# Patient Record
Sex: Male | Born: 1990 | Race: Black or African American | Hispanic: No | Marital: Single | Smoking: Former smoker
Health system: Southern US, Community
[De-identification: ages and names within clinical notes are randomized; demographics above are authoritative.]

## PROBLEM LIST (undated history)

## (undated) DIAGNOSIS — G43909 Migraine, unspecified, not intractable, without status migrainosus: Secondary | ICD-10-CM

## (undated) DIAGNOSIS — K649 Unspecified hemorrhoids: Secondary | ICD-10-CM

---

## 2015-11-02 ENCOUNTER — Emergency Department (HOSPITAL_BASED_OUTPATIENT_CLINIC_OR_DEPARTMENT_OTHER): Payer: Self-pay

## 2015-11-02 ENCOUNTER — Encounter (HOSPITAL_BASED_OUTPATIENT_CLINIC_OR_DEPARTMENT_OTHER): Payer: Self-pay | Admitting: Emergency Medicine

## 2015-11-02 DIAGNOSIS — W1839XA Other fall on same level, initial encounter: Secondary | ICD-10-CM | POA: Insufficient documentation

## 2015-11-02 DIAGNOSIS — Z87891 Personal history of nicotine dependence: Secondary | ICD-10-CM | POA: Insufficient documentation

## 2015-11-02 DIAGNOSIS — S138XXA Sprain of joints and ligaments of other parts of neck, initial encounter: Secondary | ICD-10-CM | POA: Insufficient documentation

## 2015-11-02 DIAGNOSIS — Z8679 Personal history of other diseases of the circulatory system: Secondary | ICD-10-CM | POA: Insufficient documentation

## 2015-11-02 DIAGNOSIS — Y9289 Other specified places as the place of occurrence of the external cause: Secondary | ICD-10-CM | POA: Insufficient documentation

## 2015-11-02 DIAGNOSIS — Y998 Other external cause status: Secondary | ICD-10-CM | POA: Insufficient documentation

## 2015-11-02 DIAGNOSIS — Y9344 Activity, trampolining: Secondary | ICD-10-CM | POA: Insufficient documentation

## 2015-11-02 DIAGNOSIS — Z8719 Personal history of other diseases of the digestive system: Secondary | ICD-10-CM | POA: Insufficient documentation

## 2015-11-02 NOTE — ED Notes (Signed)
Injury to neck at 2pm while jumping on trampoline. Pain and tenderness. C-Collar placed in traige.

## 2015-11-03 ENCOUNTER — Emergency Department (HOSPITAL_BASED_OUTPATIENT_CLINIC_OR_DEPARTMENT_OTHER)
Admission: EM | Admit: 2015-11-03 | Discharge: 2015-11-03 | Disposition: A | Discharge status: Home / Self Care | Payer: Self-pay | Attending: Emergency Medicine | Admitting: Emergency Medicine

## 2015-11-03 DIAGNOSIS — IMO0001 Reserved for inherently not codable concepts without codable children: Secondary | ICD-10-CM

## 2015-11-03 DIAGNOSIS — S139XXA Sprain of joints and ligaments of unspecified parts of neck, initial encounter: Secondary | ICD-10-CM

## 2015-11-03 HISTORY — DX: Unspecified hemorrhoids: K64.9

## 2015-11-03 HISTORY — DX: Migraine, unspecified, not intractable, without status migrainosus: G43.909

## 2015-11-03 MED ORDER — NAPROXEN 250 MG PO TABS
500.0000 mg | ORAL_TABLET | Freq: Once | ORAL | Status: AC
  Administered 2015-11-03: 500 mg via ORAL
  Filled 2015-11-03: qty 2

## 2015-11-03 MED ORDER — HYDROCODONE-ACETAMINOPHEN 5-325 MG PO TABS: 1.0000 | ORAL_TABLET | Freq: Four times a day (QID) | ORAL | Status: DC | PRN

## 2015-11-03 NOTE — ED Provider Notes (Addendum)
CSN: [id number]     Arrival date & time 11/02/15  2251 History   First MD Initiated Contact with Patient 11/03/15 0132     Chief Complaint  Patient presents with  . Neck Injury     (Consider location/radiation/quality/duration/timing/severity/associated sxs/prior Treatment) HPI  This is a 25 year old male who was jumping on a trampoline with his children about 2 PM yesterday afternoon. In attempting to do a back flip he hyperflexed his neck and felt a pop. He has subsequently had moderate to severe posterior midline neck pain worse with movement of the neck. He is not having any numbness or weakness. He was placed in a c-collar on arrival.  Past Medical History  Diagnosis Date  . Migraines   . Hemorrhoids    History reviewed. No pertinent past surgical history. No family history on file. Social History  Substance Use Topics  . Smoking status: Former Games developer  . Smokeless tobacco: None  . Alcohol Use: No    Review of Systems  All other systems reviewed and are negative.   Allergies  Review of patient's allergies indicates no known allergies.  Home Medications   Prior to Admission medications   Not on File   BP 121/82 mmHg  Pulse 65  Temp(Src) 97.7 F (36.5 C) (Oral)  Resp 20  Ht 6' (1.829 m)  Wt 185 lb (83.915 kg)  BMI 25.08 kg/m2  SpO2 100%   Physical Exam  General: Well-developed, well-nourished male in no acute distress; appearance consistent with age of record HENT: normocephalic; atraumatic Eyes: pupils equal, round and reactive to light; extraocular muscles intact Neck: supple; posterior midline tenderness; pain reproduced on rotation, flexion and extension of neck; no muscular tenderness or spasm palpated Heart: regular rate and rhythm Lungs: clear to auscultation bilaterally Abdomen: soft; nondistended; nontender; bowel sounds present Back: No T-spine or LS-spine tenderness Extremities: No deformity; full range of motion; pulses normal Neurologic:  Awake, alert and oriented; motor function intact in all extremities and symmetric; no facial droop Skin: Warm and dry Psychiatric: Normal mood and affect    ED Course  Procedures (including critical care time)   MDM  Nursing notes and vitals signs, including pulse oximetry, reviewed.  Summary of this visit's results, reviewed by myself:  Imaging Studies: Ct Cervical Spine Wo Contrast  11/03/2015  CLINICAL DATA:  Neck injury 12 hours prior while jumping on trampoline. Neck pain and tenderness. Fall. EXAM: CT CERVICAL SPINE WITHOUT CONTRAST TECHNIQUE: Multidetector CT imaging of the cervical spine was performed without intravenous contrast. Multiplanar CT image reconstructions were also generated. COMPARISON:  None. FINDINGS: Mild broad-based leftward curvature, alignment is otherwise maintained. Vertebral body heights and intervertebral disc spaces are preserved. There is no fracture. The dens is intact. There are no jumped or perched facets. No prevertebral soft tissue edema. IMPRESSION: Mild leftward curvature of the spine, may be muscle spasm or positioning versus scoliosis. No acute fracture or subluxation. Electronically Signed   By: Rubye Oaks M.D.   On: 11/03/2015 01:29   We'll discharge with [place] collar and refer to sports medicine.    Paula Libra, MD 11/03/15 0140  Paula Libra, MD 11/03/15 7829  Paula Libra, MD 11/03/15 [phone]

## 2015-11-03 NOTE — ED Notes (Signed)
Patient transported to CT 

## 2015-11-03 NOTE — ED Notes (Signed)
Pt discharged home with c collar in place per edp order.

## 2015-11-04 ENCOUNTER — Ambulatory Visit (INDEPENDENT_AMBULATORY_CARE_PROVIDER_SITE_OTHER): Payer: 59 | Admitting: Family Medicine

## 2015-11-04 ENCOUNTER — Encounter: Payer: Self-pay | Admitting: Family Medicine

## 2015-11-04 VITALS — BP 123/74 | HR 73 | Ht 72.0 in | Wt 185.0 lb

## 2015-11-04 DIAGNOSIS — S199XXA Unspecified injury of neck, initial encounter: Secondary | ICD-10-CM | POA: Diagnosis not present

## 2015-11-04 MED ORDER — PREDNISONE 10 MG PO TABS: ORAL_TABLET | ORAL | Status: DC

## 2015-11-04 MED ORDER — DICLOFENAC SODIUM 75 MG PO TBEC: 75.0000 mg | DELAYED_RELEASE_TABLET | Freq: Two times a day (BID) | ORAL | Status: DC

## 2015-11-04 MED ORDER — OXYCODONE-ACETAMINOPHEN 7.5-325 MG PO TABS: 1.0000 | ORAL_TABLET | Freq: Four times a day (QID) | ORAL | Status: DC | PRN

## 2015-11-04 NOTE — Patient Instructions (Signed)
You sustained a whiplash injury and severe cervical strain. A prednisone dose pack is the best option for immediate relief and may be prescribed. Day after finishing prednisone start voltaren  twice a day with food for pain and inflammation. Percocet as needed for severe pain (no driving on this medicine). Stay as active as possible - active assist motion exercises twice a day. Cervical collar other times. Physical therapy has been shown to be helpful as well though I would wait until I see you back to start this. If not improving, will consider further imaging (MRI). Follow up with me in 10-14 days.

## 2015-11-06 DIAGNOSIS — S199XXA Unspecified injury of neck, initial encounter: Secondary | ICD-10-CM | POA: Insufficient documentation

## 2015-11-06 NOTE — Assessment & Plan Note (Signed)
severe hyperflexion injury causing severe cervical strain.  Continue with cervical collar though come out of this at least twice a day to work on regaining motion.  Prednisone dose pack with transition to voltaren.  Percocet as needed.  Work note provided.  F/u in 10-14 days.  Physical therapy if improving, MRI if not improving.

## 2015-11-06 NOTE — Progress Notes (Signed)
PCP: No PCP Per Patient  Subjective:   HPI: Patient is a 25 y.o. male here for neck injury.  Patient reports on 2/25 he was jumping on a trampoline. Did a backflip but landed on head/neck hyperflexing his neck. Several pops felt at the time and severe pain in neck. No radiation into extremities. No numbness or tingling. CT cervical spine normal in ED. Still with 9/10 level of pain, sharp diffuse posterior neck. Wearing a cervical collar, using icy hot and heating pad.  Not much help with norco. No skin changes, fever.  Past Medical History  Diagnosis Date  . Migraines   . Hemorrhoids     Current Outpatient Prescriptions on File Prior to Visit  Medication Sig Dispense Refill  . HYDROcodone-acetaminophen (NORCO) 5-325 MG tablet Take 1-2 tablets by mouth every 6 (six) hours as needed (for pain). 10 tablet 0   No current facility-administered medications on file prior to visit.    No past surgical history on file.  No Known Allergies  Social History   Social History  . Marital Status: Single    Spouse Name: N/A  . Number of Children: N/A  . Years of Education: N/A   Occupational History  . Not on file.   Social History Main Topics  . Smoking status: Former Games developer  . Smokeless tobacco: Not on file  . Alcohol Use: No  . Drug Use: Yes    Special: Marijuana     Comment: last used a few days ago  . Sexual Activity: Not on file   Other Topics Concern  . Not on file   Social History Narrative    No family history on file.  BP 123/74 mmHg  Pulse 73  Ht 6' (1.829 m)  Wt 185 lb (83.915 kg)  BMI 25.08 kg/m2  Review of Systems: See HPI above.    Objective:  Physical Exam:  Gen: NAD, wearing cervical collar, appears uncomfortable.  Neck: No gross deformity, swelling, bruising.  Spasms bilateral cervical paraspinal regions. TTP diffusely posteriorly cervical spine and paraspinal regions.  No midline/bony TTP. Only 5 degrees motion all directions, all  painful. BUE strength 5/5.   Sensation intact to light touch.   1+ equal reflexes in triceps, biceps, brachioradialis tendons. NV intact distal BUEs.    Assessment & Plan:  1. Neck injury - severe hyperflexion injury causing severe cervical strain.  Continue with cervical collar though come out of this at least twice a day to work on regaining motion.  Prednisone dose pack with transition to voltaren.  Percocet as needed.  Work note provided.  F/u in 10-14 days.  Physical therapy if improving, MRI if not improving.

## 2015-11-13 ENCOUNTER — Ambulatory Visit (INDEPENDENT_AMBULATORY_CARE_PROVIDER_SITE_OTHER): Payer: 59 | Admitting: Family Medicine

## 2015-11-13 ENCOUNTER — Encounter: Payer: Self-pay | Admitting: Family Medicine

## 2015-11-13 VITALS — BP 121/78 | HR 83 | Ht 72.0 in | Wt 185.0 lb

## 2015-11-13 DIAGNOSIS — S199XXD Unspecified injury of neck, subsequent encounter: Secondary | ICD-10-CM | POA: Diagnosis not present

## 2015-11-13 NOTE — Patient Instructions (Signed)
Call me in 2-3 weeks if you want to do physical therapy or you have any questions. Otherwise follow up with me as needed.

## 2015-11-15 NOTE — Progress Notes (Signed)
PCP: No PCP Per Patient  Subjective:   HPI: Patient is a 25 y.o. male here for neck injury.  2/27: Patient reports on 2/25 he was jumping on a trampoline. Did a backflip but landed on head/neck hyperflexing his neck. Several pops felt at the time and severe pain in neck. No radiation into extremities. No numbness or tingling. CT cervical spine normal in ED. Still with 9/10 level of pain, sharp diffuse posterior neck. Wearing a cervical collar, using icy hot and heating pad.  Not much help with norco. No skin changes, fever.  3/8: Patient reports he feels significantly better since last visit. His pain is currently 0/10. Some soreness just medial to and above shoulder blades. Using cervical collar. S/p using voltaren, prednisone, and percocet. No skin changes, fever. No radiation into extremities. No numbness or tingling.  Past Medical History  Diagnosis Date  . Migraines   . Hemorrhoids     Current Outpatient Prescriptions on File Prior to Visit  Medication Sig Dispense Refill  . diclofenac (VOLTAREN) 75 MG EC tablet Take 1 tablet (75 mg total) by mouth 2 (two) times daily. Start day AFTER finishing prednisone 60 tablet 1  . HYDROcodone-acetaminophen (NORCO) 5-325 MG tablet Take 1-2 tablets by mouth every 6 (six) hours as needed (for pain). 10 tablet 0  . oxyCODONE-acetaminophen (PERCOCET) 7.5-325 MG tablet Take 1 tablet by mouth every 6 (six) hours as needed for severe pain. 60 tablet 0  . predniSONE (DELTASONE) 10 MG tablet 6 tabs po day 1, 5 tabs po day 2, 4 tabs po day 3, 3 tabs po day 4, 2 tabs po day 5, 1 tab po day 6 21 tablet 0   No current facility-administered medications on file prior to visit.    No past surgical history on file.  No Known Allergies  Social History   Social History  . Marital Status: Single    Spouse Name: N/A  . Number of Children: N/A  . Years of Education: N/A   Occupational History  . Not on file.   Social History Main Topics   . Smoking status: Former Games developermoker  . Smokeless tobacco: Not on file  . Alcohol Use: No  . Drug Use: Yes    Special: Marijuana     Comment: last used a few days ago  . Sexual Activity: Not on file   Other Topics Concern  . Not on file   Social History Narrative    No family history on file.  BP 121/78 mmHg  Pulse 83  Ht 6' (1.829 m)  Wt 185 lb (83.915 kg)  BMI 25.08 kg/m2  Review of Systems: See HPI above.    Objective:  Physical Exam:  Gen: NAD, wearing cervical collar, appears uncomfortable.  Neck: No gross deformity, swelling, bruising.   Minimal TTP bilateral trapezius muscles.  No midline/bony TTP. FROM. BUE strength 5/5.   Sensation intact to light touch.   1+ equal reflexes in triceps, biceps, brachioradialis tendons. NV intact distal BUEs.    Assessment & Plan:  1. Neck injury - severe hyperflexion injury causing severe cervical strain.  Significant improvement over past week following prednisone, voltaren, percocet, and collar.  Discontinue cervical collar.  Not needing medication now.  Simple home exercises.  Call us if still not improving over next 2-3 weeks and will consider physical therapy.

## 2015-11-15 NOTE — Assessment & Plan Note (Signed)
severe hyperflexion injury causing severe cervical strain.  Significant improvement over past week following prednisone, voltaren, percocet, and collar.  Discontinue cervical collar.  Not needing medication now.  Simple home exercises.  Call us if still not improving over next 2-3 weeks and will consider physical therapy.

## 2017-04-23 IMAGING — CT CT CERVICAL SPINE W/O CM
3 of 4 series · 12 of 33 positions shown, 14 images · non-contrast
Comparison: None.

CLINICAL DATA: Neck injury 12 hours prior while jumping on
trampoline. Neck pain and tenderness. Fall.

EXAM:
CT CERVICAL SPINE WITHOUT CONTRAST
TECHNIQUE: Multidetector CT imaging of the cervical spine was performed without
intravenous contrast. Multiplanar CT image reconstructions were also
generated.

[Series 6: sagittal bone · sagittal · 0.37mm/px · 5 of 74 slices shown, 6 images]
[im 25/74  bone]
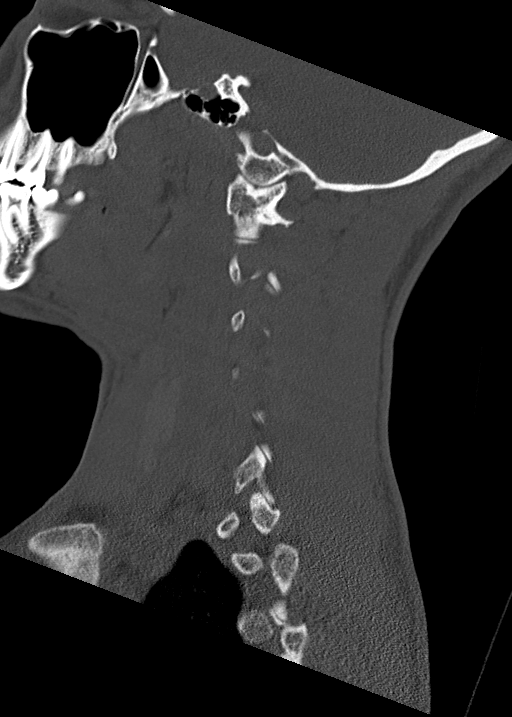
[im 31/74  bone]
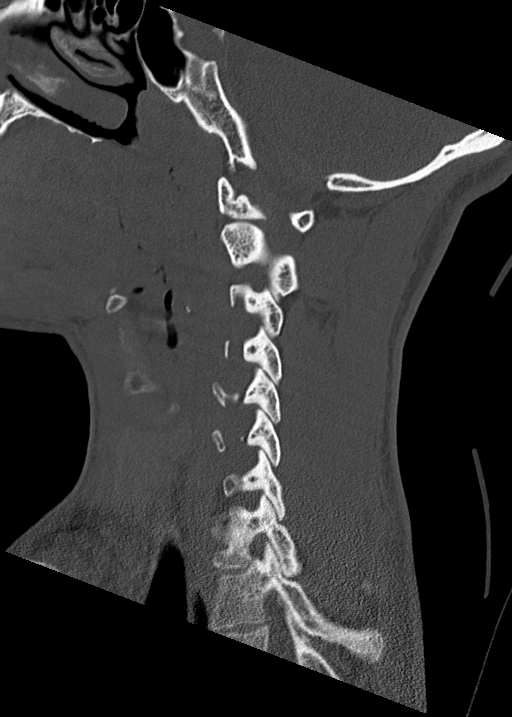
[im 37/74  soft-tissue]
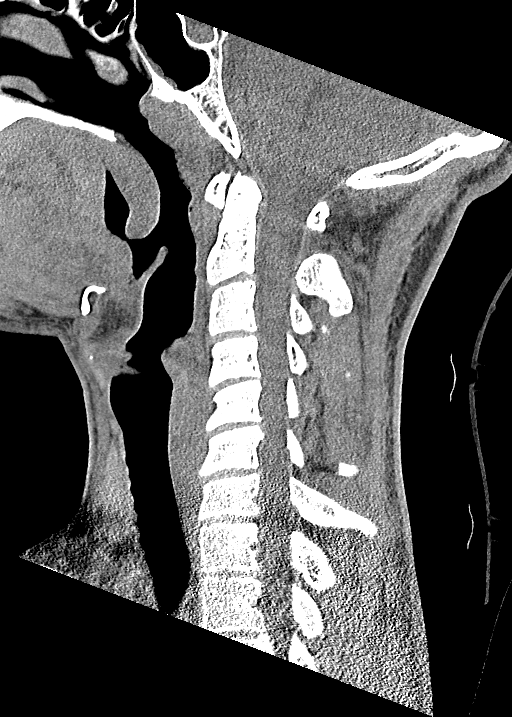
[im 37/74  bone]
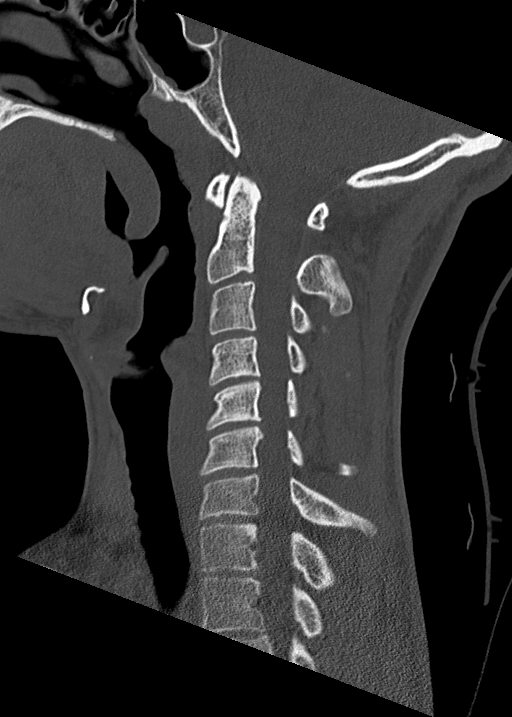
[im 43/74  bone]
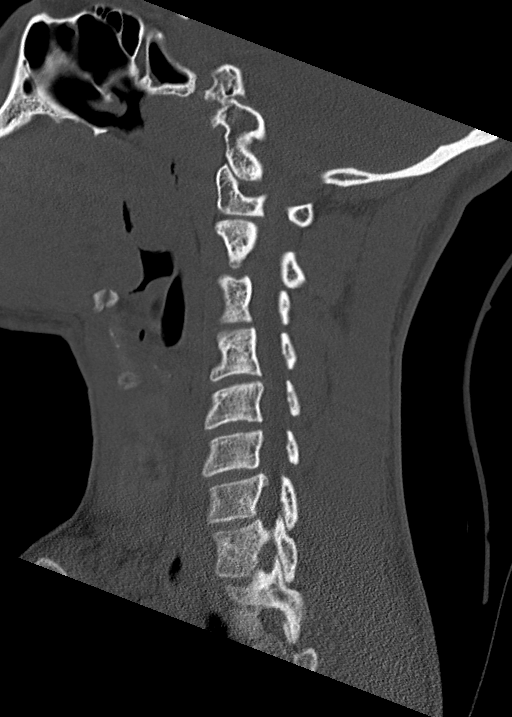
[im 49/74  bone]
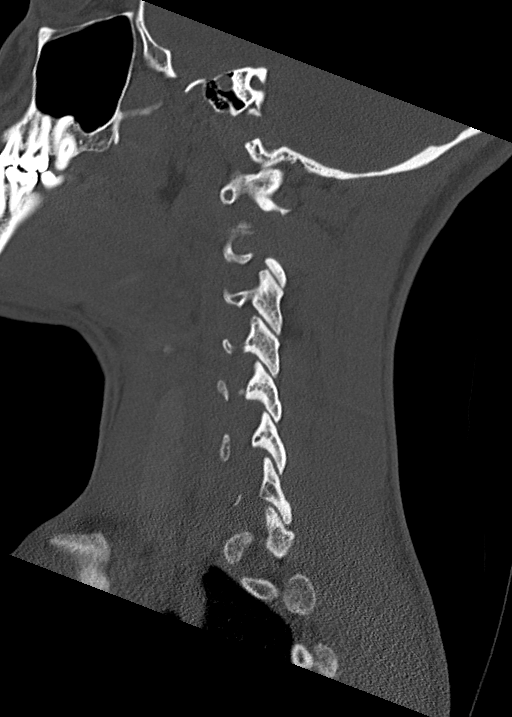

[Series 7: coronal bone · coronal · 0.30mm/px · 3 of 72 slices shown]
[im 15/72  bone]
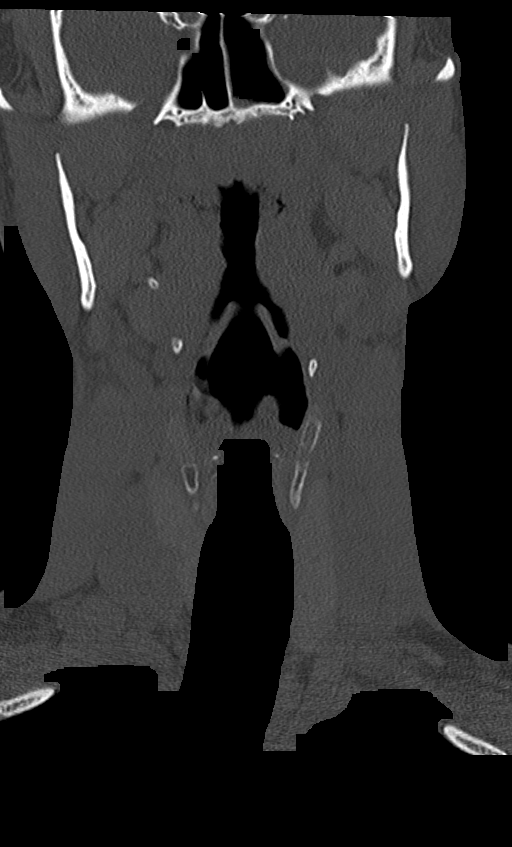
[im 29/72  bone]
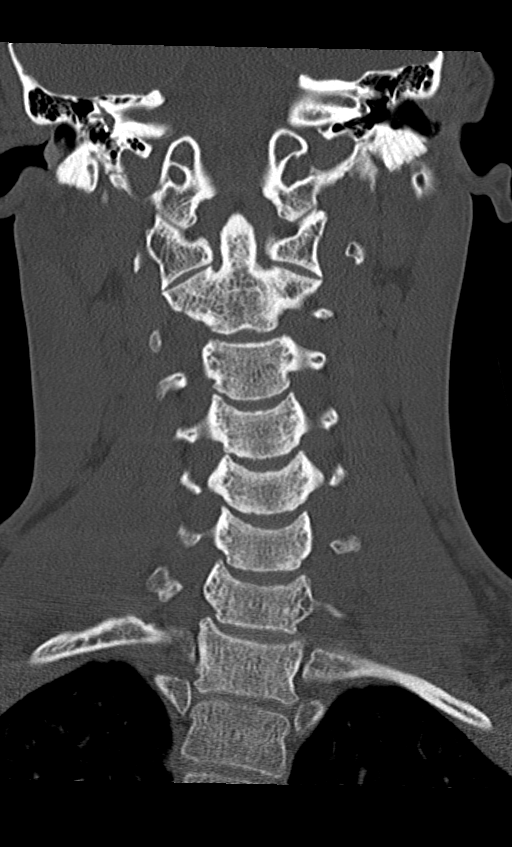
[im 43/72  bone]
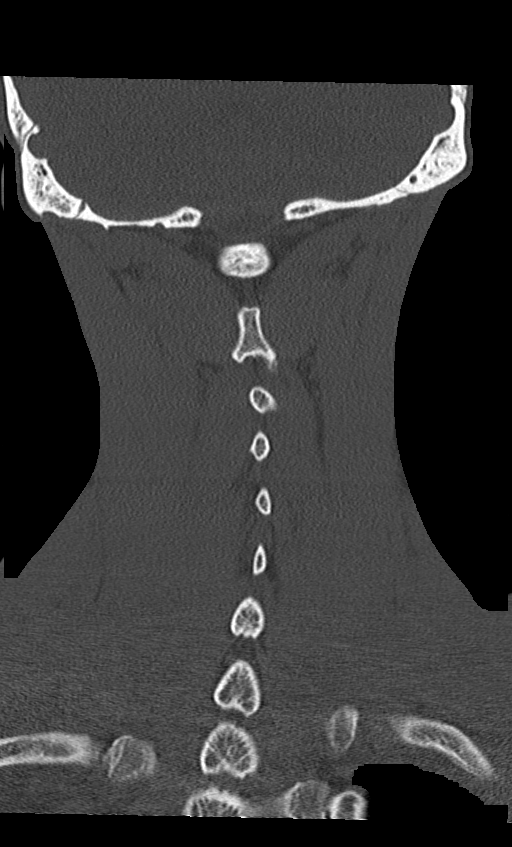

[Series 8: orthogonal bone · axial · 0.28mm/px · z∈[+732,+892]mm · 4 of 120 slices shown, 5 images]
[im 18/120  soft-tissue]
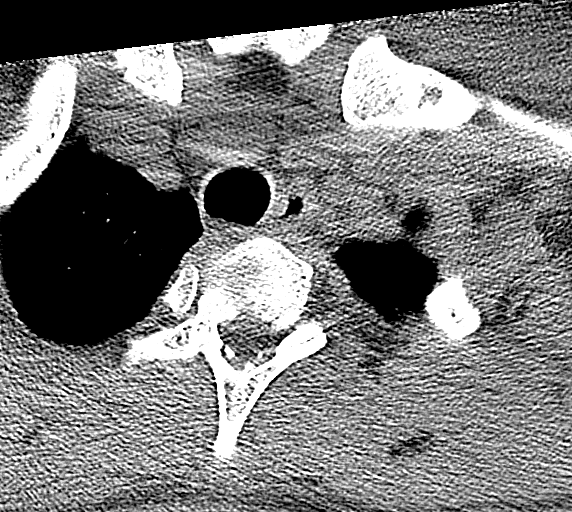
[im 18/120  bone]
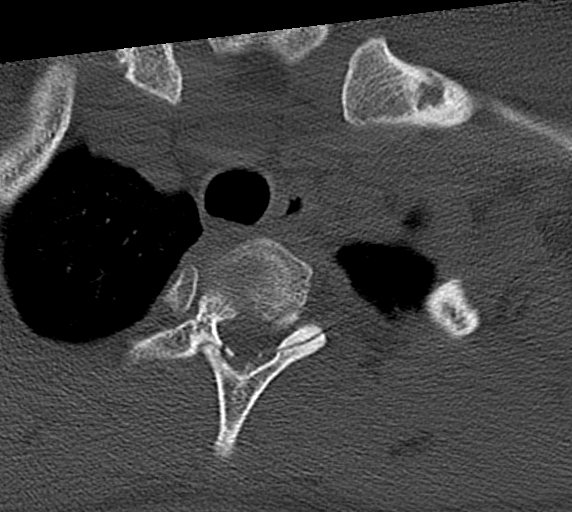
[im 52/120  bone]
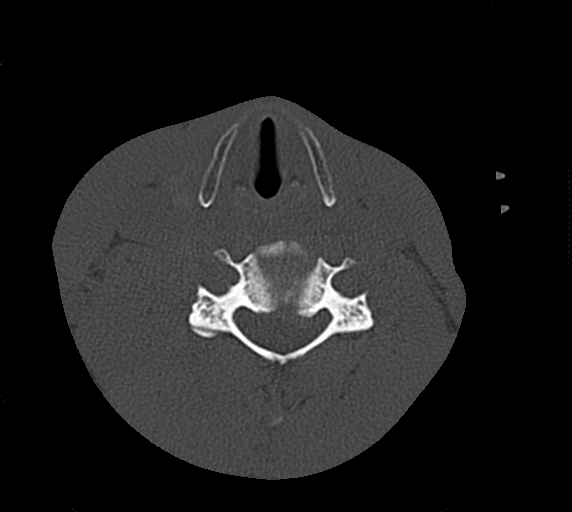
[im 69/120  bone]
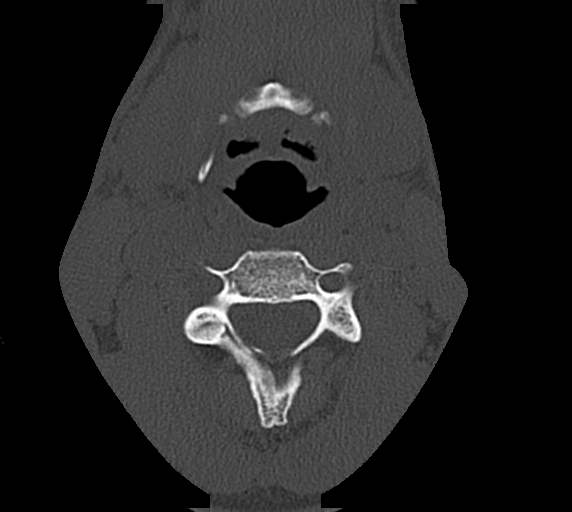
[im 103/120  bone]
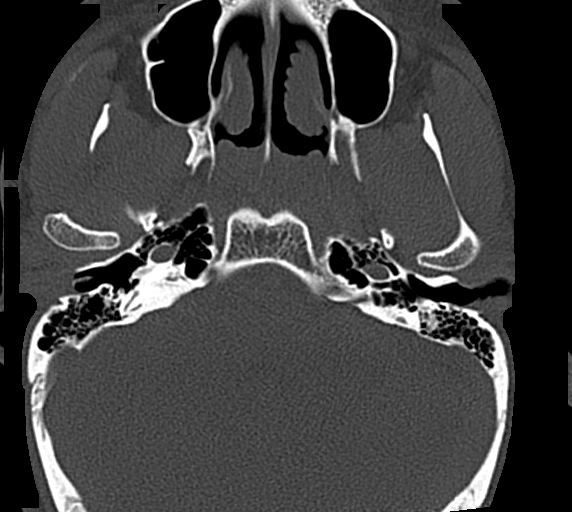

[12 of 33 positions shown; findings below may reference images not displayed]

FINDINGS: Mild broad-based leftward curvature, alignment is otherwise
maintained. Vertebral body heights and intervertebral disc spaces
are preserved. There is no fracture. The dens is intact. There are
no jumped or perched facets. No prevertebral soft tissue edema.
IMPRESSION: Mild leftward curvature of the spine, may be muscle spasm or
positioning versus scoliosis. No acute fracture or subluxation.

## 2017-06-10 DIAGNOSIS — G43009 Migraine without aura, not intractable, without status migrainosus: Secondary | ICD-10-CM | POA: Insufficient documentation

## 2017-06-10 DIAGNOSIS — F17200 Nicotine dependence, unspecified, uncomplicated: Secondary | ICD-10-CM | POA: Insufficient documentation

## 2019-06-05 ENCOUNTER — Ambulatory Visit
Admission: EM | Admit: 2019-06-05 | Discharge: 2019-06-05 | Disposition: A | Discharge status: Home / Self Care | Payer: BC Managed Care – PPO | Attending: Physician Assistant | Admitting: Physician Assistant

## 2019-06-05 ENCOUNTER — Encounter: Payer: Self-pay | Admitting: Emergency Medicine

## 2019-06-05 ENCOUNTER — Other Ambulatory Visit: Payer: Self-pay

## 2019-06-05 DIAGNOSIS — G8929 Other chronic pain: Secondary | ICD-10-CM

## 2019-06-05 DIAGNOSIS — M545 Low back pain, unspecified: Secondary | ICD-10-CM

## 2019-06-05 MED ORDER — METHOCARBAMOL 500 MG PO TABS: 500.0000 mg | ORAL_TABLET | Freq: Two times a day (BID) | ORAL | 0 refills | Status: DC

## 2019-06-05 MED ORDER — MELOXICAM 7.5 MG PO TABS: 7.5000 mg | ORAL_TABLET | Freq: Every day | ORAL | 0 refills | Status: DC

## 2019-06-05 NOTE — ED Provider Notes (Signed)
[facility]    CSN: [id number] Arrival date & time: 06/05/19  1443      History   Chief Complaint Chief Complaint  Patient presents with  . Back Pain    HPI Ballard Dipaola is a 28 y.o. male.   28 year old male comes in for 35-month history of low back pain that has worsened in the past 4 days.  Denies injury/trauma.  States pain is now constant, worse with movement.  Pain is mostly left-sided, but can be right-sided/bilateral depending on movement.  Denies saddle anesthesia, loss of bladder or bowel control.  Denies numbness/tingling.  Denies radiation down lower extremities.  Work requires heavy lifting.  Patient has tried naproxen to 20 mg twice daily for few days, IcyHot, warm compress without relief.  He has also tried increasing exercise, stretches without relief.     Past Medical History:  Diagnosis Date  . Hemorrhoids   . Migraines     Patient Active Problem List   Diagnosis Date Noted  . Neck injury 11/06/2015    History reviewed. No pertinent surgical history.     Home Medications    Prior to Admission medications   Medication Sig Start Date End Date Taking? Authorizing Provider  meloxicam (MOBIC) 7.5 MG tablet Take 1 tablet (7.5 mg total) by mouth daily. 06/05/19   Tasia Catchings,  V, PA-C  methocarbamol (ROBAXIN) 500 MG tablet Take 1 tablet (500 mg total) by mouth 2 (two) times daily. 06/05/19   Ok Edwards, PA-C    Family History History reviewed. No pertinent family history.  Social History Social History   Tobacco Use  . Smoking status: Former Research scientist (life sciences)  . Smokeless tobacco: Never Used  Substance Use Topics  . Alcohol use: No    Alcohol/week: 0.0 standard drinks  . Drug use: Yes    Types: Marijuana    Comment: last used a few days ago     Allergies   Patient has no known allergies.   Review of Systems Review of Systems  Reason unable to perform ROS: See HPI as above.     Physical Exam Triage Vital Signs ED Triage Vitals  Enc Vitals  Group     BP 06/05/19 1454 109/70     Pulse Rate 06/05/19 1454 96     Resp 06/05/19 1454 18     Temp 06/05/19 1454 98.6 F (37 C)     Temp Source 06/05/19 1454 Oral     SpO2 06/05/19 1454 98 %     Weight --      Height --      Head Circumference --      Peak Flow --      Pain Score 06/05/19 1457 5     Pain Loc --      Pain Edu? --      Excl. in [place]? --    No data found.  Updated Vital Signs BP 109/70 (BP Location: Left Arm)   Pulse 96   Temp 98.6 F (37 C) (Oral)   Resp 18   SpO2 98%   Physical Exam Constitutional:      General: He is not in acute distress.    Appearance: He is well-developed. He is not diaphoretic.  HENT:     Head: Normocephalic and atraumatic.  Eyes:     Conjunctiva/sclera: Conjunctivae normal.     Pupils: Pupils are equal, round, and reactive to light.  Cardiovascular:     Rate and Rhythm: Normal rate and regular rhythm.  Heart sounds: Normal heart sounds. No murmur. No friction rub. No gallop.   Pulmonary:     Effort: Pulmonary effort is normal. No accessory muscle usage or respiratory distress.     Breath sounds: Normal breath sounds. No stridor. No decreased breath sounds, wheezing, rhonchi or rales.  Musculoskeletal:     Comments: Leaning to right hip when sitting in chair. No rashes, erythema, contusion. No tenderness on palpation of spinous processes, bilateral back. However, ROM of back and hip can illicit pain. Full range of motion of back and hips. Sensation intact and equal bilaterally. Negative straight leg raise.   Skin:    General: Skin is warm and dry.  Neurological:     Mental Status: He is alert and oriented to person, place, and time.      UC Treatments / Results  Labs (all labs ordered are listed, but only abnormal results are displayed) Labs Reviewed - No data to display  EKG   Radiology No results found.  Procedures Procedures (including critical care time)  Medications Ordered in UC Medications - No data to  display  Initial Impression / Assessment and Plan / UC Course  I have reviewed the triage vital signs and the nursing notes.  Pertinent labs & imaging results that were available during my care of the patient were reviewed by me and considered in my medical decision making (see chart for details).    Offered toradol, patient declined at [facility] stime. Start NSAID as directed for pain and inflammation. Muscle relaxant as needed. Ice/heat compresses. Will have patient follow up with sports medicine if symptoms not improving.  Return precautions given.   Final Clinical Impressions(s) / UC Diagnoses   Final diagnoses:  Chronic left-sided low back pain without sciatica   ED Prescriptions    Medication Sig Dispense Auth. Provider   meloxicam (MOBIC) 7.5 MG tablet Take 1 tablet (7.5 mg total) by mouth daily. 15 tablet ,  V, PA-C   methocarbamol (ROBAXIN) 500 MG tablet Take 1 tablet (500 mg total) by mouth 2 (two) times daily. 20 tablet Belinda Fisher, PA-C     PDMP not reviewed this encounter.   Belinda Fisher, PA-C 06/05/19 [phone]

## 2019-06-05 NOTE — ED Triage Notes (Signed)
Patient presents to [facility] for assessment of 3 months of lower left back pain, no radiating to his left thigh and hip.  Denied numbness/tingling.  Denies changes in bowel or bladder.  States he has been trying to exercise, do stretches, sit ups and jogging but it seems to be making everything worse.

## 2019-06-05 NOTE — Discharge Instructions (Signed)
Start Mobic. Do not take ibuprofen (motrin/advil)/ naproxen (aleve) while on mobic. Robaxin as needed, this can make you drowsy, so do not take if you are going to drive, operate heavy machinery, or make important decisions. Ice/heat compresses as needed. This can take up to 3-4 weeks to completely resolve, but you should be feeling better each week. Follow up with sports medicine if symptoms worsen, changes for reevaluation. If experience numbness/tingling of the inner thighs, loss of bladder or bowel control, go to the emergency department for evaluation.

## 2019-06-05 NOTE — ED Notes (Signed)
Patient able to ambulate independently  

## 2019-07-26 ENCOUNTER — Other Ambulatory Visit: Payer: Self-pay | Admitting: Neurosurgery

## 2019-07-26 DIAGNOSIS — M5442 Lumbago with sciatica, left side: Secondary | ICD-10-CM

## 2019-07-26 DIAGNOSIS — G8929 Other chronic pain: Secondary | ICD-10-CM

## 2019-08-22 ENCOUNTER — Ambulatory Visit
Admission: RE | Admit: 2019-08-22 | Discharge: 2019-08-22 | Disposition: A | Discharge status: Home / Self Care | Payer: BC Managed Care – PPO | Source: Ambulatory Visit | Attending: Neurosurgery | Admitting: Neurosurgery

## 2019-08-22 ENCOUNTER — Other Ambulatory Visit: Payer: Self-pay

## 2019-08-22 DIAGNOSIS — M5442 Lumbago with sciatica, left side: Secondary | ICD-10-CM

## 2022-02-10 ENCOUNTER — Other Ambulatory Visit: Payer: Self-pay

## 2022-02-10 ENCOUNTER — Ambulatory Visit
Admission: EM | Admit: 2022-02-10 | Discharge: 2022-02-10 | Disposition: A | Discharge status: Home / Self Care | Payer: BC Managed Care – PPO | Attending: Student | Admitting: Student

## 2022-02-10 ENCOUNTER — Encounter: Payer: Self-pay | Admitting: Emergency Medicine

## 2022-02-10 DIAGNOSIS — M7712 Lateral epicondylitis, left elbow: Secondary | ICD-10-CM | POA: Diagnosis not present

## 2022-02-10 DIAGNOSIS — M778 Other enthesopathies, not elsewhere classified: Secondary | ICD-10-CM | POA: Diagnosis not present

## 2022-02-10 MED ORDER — PREDNISONE 20 MG PO TABS: 40.0000 mg | ORAL_TABLET | Freq: Every day | ORAL | 0 refills | Status: AC

## 2022-02-10 NOTE — Discharge Instructions (Addendum)
-  Prednisone, 2 pills taken at the same time for 3 days in a row.  Try taking this earlier in the day as it can give you energy. Avoid NSAIDs like ibuprofen and alleve while taking this medication as they can increase your risk of stomach upset and even GI bleeding when in combination with a steroid. You can continue tylenol (acetaminophen) up to 1000mg  3x daily. -Rest, ice, tylenol/ibuprofen -If symptoms persist in 3-5 days, follow-up with an orthopedist. I recommend [facility] at [address], [place], [place]. You can schedule an appointment by calling [phone]) or online [phone]), but they also have a walk-in clinic M-F 8a-8p and Sat 10a-3p.

## 2022-02-10 NOTE — ED Provider Notes (Signed)
[facility]    CSN: [id number] Arrival date & time: 02/10/22  0810      History   Chief Complaint Chief Complaint  Patient presents with   Elbow Pain    HPI Rand Gudino is a 31 y.o. male presenting with left elbow pain for 2 weeks.  History noncontributory.  He describes nontraumatic pain of the left elbow and left wrist for about 2 weeks.  He states he does have an active job and works out a lot, but he denies any injury.  He states the pain is tolerable at rest, and he has not been able to identify any point tenderness when he presses; however, when he moves the arm or the wrist he has pain.  He has attempted various over-the-counter remedies including Tylenol, ibuprofen, heat, ice. Denies sensation changes. He is right handed.   HPI  Past Medical History:  Diagnosis Date   Hemorrhoids    Migraines     Patient Active Problem List   Diagnosis Date Noted   Neck injury 11/06/2015    History reviewed. No pertinent surgical history.     Home Medications    Prior to Admission medications   Medication Sig Start Date End Date Taking? Authorizing Provider  predniSONE (DELTASONE) 20 MG tablet Take 2 tablets (40 mg total) by mouth daily for 3 days. Take with breakfast or lunch. Avoid NSAIDs (ibuprofen, etc) while taking this medication. 02/10/22 02/13/22 Yes Rhys Martini, PA-C    Family History History reviewed. No pertinent family history.  Social History Social History   Tobacco Use   Smoking status: Former   Smokeless tobacco: Never  Substance Use Topics   Alcohol use: No    Alcohol/week: 0.0 standard drinks   Drug use: Yes    Types: Marijuana    Comment: last used a few days ago     Allergies   Patient has no known allergies.   Review of Systems Review of Systems  Musculoskeletal:        L elbow and wrist pain   All other systems reviewed and are negative.   Physical Exam Triage Vital Signs ED Triage Vitals [02/10/22 0907]  Enc Vitals  Group     BP 131/85     Pulse Rate 65     Resp 18     Temp 97.9 F (36.6 C)     Temp Source Oral     SpO2 98 %     Weight      Height      Head Circumference      Peak Flow      Pain Score 7     Pain Loc      Pain Edu?      Excl. in GC?    No data found.  Updated Vital Signs BP 131/85 (BP Location: Left Arm)   Pulse 65   Temp 97.9 F (36.6 C) (Oral)   Resp 18   SpO2 98%   Visual Acuity Right Eye Distance:   Left Eye Distance:   Bilateral Distance:    Right Eye Near:   Left Eye Near:    Bilateral Near:     Physical Exam Vitals reviewed.  Constitutional:      General: He is not in acute distress.    Appearance: Normal appearance. He is not ill-appearing.  HENT:     Head: Normocephalic and atraumatic.  Pulmonary:     Effort: Pulmonary effort is normal.  Musculoskeletal:  Comments: L arm: no skin changes or swelling. No point tenderness. L lateral epicondyle pain elicited with flexion, extension, and supination. L wrist pain elicited with finkelstein test. Cap refill <2 seconds, radial pulse 2+.  Neurological:     General: No focal deficit present.     Mental Status: He is alert and oriented to person, place, and time.  Psychiatric:        Mood and Affect: Mood normal.        Behavior: Behavior normal.        Thought Content: Thought content normal.        Judgment: Judgment normal.     UC Treatments / Results  Labs (all labs ordered are listed, but only abnormal results are displayed) Labs Reviewed - No data to display  EKG   Radiology No results found.  Procedures Procedures (including critical care time)  Medications Ordered in UC Medications - No data to display  Initial Impression / Assessment and Plan / UC Course  I have reviewed the triage vital signs and the nursing notes.  Pertinent labs & imaging results that were available during my care of the patient were reviewed by me and considered in my medical decision making (see chart  for details).     This patient is a very pleasant 31 y.o. year old male presenting with L lateral epicondylitis. Neurovascularly intact, no trauma, no bony tenderness on exam. Has already attempted RICE. Short course of prednisone sent. F/u with [facility] if symptoms persist, information provided. Work note provided. ED return precautions discussed. Patient verbalizes understanding and agreement.   Final Clinical Impressions(s) / UC Diagnoses   Final diagnoses:  Lateral epicondylitis of left elbow  Left wrist tendonitis     Discharge Instructions      -Prednisone, 2 pills taken at the same time for 3 days in a row.  Try taking this earlier in the day as it can give you energy. Avoid NSAIDs like ibuprofen and alleve while taking this medication as they can increase your risk of stomach upset and even GI bleeding when in combination with a steroid. You can continue tylenol (acetaminophen) up to 1000mg  3x daily. -Rest, ice, tylenol/ibuprofen -If symptoms persist in 3-5 days, follow-up with an orthopedist. I recommend [facility] at [address], [place], [place]. You can schedule an appointment by calling [phone]) or online [phone]), but they also have a walk-in clinic M-F 8a-8p and Sat 10a-3p.    ED Prescriptions     Medication Sig Dispense Auth. Provider   predniSONE (DELTASONE) 20 MG tablet Take 2 tablets (40 mg total) by mouth daily for 3 days. Take with breakfast or lunch. Avoid NSAIDs (ibuprofen, etc) while taking this medication. 6 tablet [email or web address] PA-C      PDMP not reviewed this encounter.   Rhys Martini, PA-C 02/10/22 1006

## 2022-02-10 NOTE — ED Triage Notes (Signed)
Pt here for left elbow and arm pain x 2 weeks; worse with movement and denies obvious injury

## 2023-11-18 ENCOUNTER — Encounter: Payer: Self-pay | Admitting: Emergency Medicine

## 2023-11-18 ENCOUNTER — Ambulatory Visit: Admission: EM | Admit: 2023-11-18 | Discharge: 2023-11-18 | Disposition: A | Discharge status: Home / Self Care

## 2023-11-18 DIAGNOSIS — S61213A Laceration without foreign body of left middle finger without damage to nail, initial encounter: Secondary | ICD-10-CM | POA: Diagnosis not present

## 2023-11-18 MED ORDER — TETANUS-DIPHTH-ACELL PERTUSSIS 5-2.5-18.5 LF-MCG/0.5 IM SUSY
0.5000 mL | PREFILLED_SYRINGE | Freq: Once | INTRAMUSCULAR | Status: AC
  Administered 2023-11-18: 0.5 mL via INTRAMUSCULAR

## 2023-11-18 NOTE — ED Provider Notes (Signed)
 [facility]    CSN: [id number] Arrival date & time: 11/18/23  0827      History   Chief Complaint Chief Complaint  Patient presents with   Finger Injury    HPI Larry Stevenson is a 33 y.o. male.   Patient presents with laceration to distal end of left middle finger that happened this morning.  Reports that he was working on his car when he accidentally cut his finger.  He is not sure of his last tetanus vaccination.  Reports that he did wash his finger.     Past Medical History:  Diagnosis Date   Hemorrhoids    Migraines     Patient Active Problem List   Diagnosis Date Noted   Current every day smoker 06/10/2017   Migraine without aura and without status migrainosus, not intractable 06/10/2017   Neck injury 11/06/2015    History reviewed. No pertinent surgical history.     Home Medications    Prior to Admission medications   Medication Sig Start Date End Date Taking? Authorizing Provider  rizatriptan (MAXALT) 5 MG tablet Take by mouth. 06/10/17  Yes [provider]    Family History History reviewed. No pertinent family history.  Social History Social History   Tobacco Use   Smoking status: Every Day    Types: Cigarettes, Cigars   Smokeless tobacco: Never  Vaping Use   Vaping status: Never Used  Substance Use Topics   Alcohol use: No    Alcohol/week: 0.0 standard drinks of alcohol   Drug use: Yes    Types: Marijuana    Comment: last used a few days ago     Allergies   Patient has no known allergies.   Review of Systems Review of Systems Per HPI  Physical Exam Triage Vital Signs ED Triage Vitals  Encounter Vitals Group     BP 11/18/23 0907 (!) 151/89     Systolic BP Percentile --      Diastolic BP Percentile --      Pulse Rate 11/18/23 0907 67     Resp 11/18/23 0907 18     Temp 11/18/23 0907 98.4 F (36.9 C)     Temp Source 11/18/23 0907 Oral     SpO2 11/18/23 0907 98 %     Weight 11/18/23 0905 184 lb 15.5 oz  (83.9 kg)     Height --      Head Circumference --      Peak Flow --      Pain Score 11/18/23 0904 0     Pain Loc --      Pain Education --      Exclude from Growth Chart --    No data found.  Updated Vital Signs BP (!) 151/89 (BP Location: Left Arm)   Pulse 67   Temp 98.4 F (36.9 C) (Oral)   Resp 18   Wt 184 lb 15.5 oz (83.9 kg)   SpO2 98%   BMI 25.09 kg/m   Visual Acuity Right Eye Distance:   Left Eye Distance:   Bilateral Distance:    Right Eye Near:   Left Eye Near:    Bilateral Near:     Physical Exam Constitutional:      General: He is not in acute distress.    Appearance: Normal appearance. He is not toxic-appearing or diaphoretic.  HENT:     Head: Normocephalic and atraumatic.  Eyes:     Extraocular Movements: Extraocular movements intact.  Conjunctiva/sclera: Conjunctivae normal.  Pulmonary:     Effort: Pulmonary effort is normal.  Skin:    Comments: Patient has approximately 2 cm curvilinear superficial laceration present to medial portion of pad of left middle finger.  Nail is intact.  Capillary refill and pulses intact.  Full range of motion of finger present.  Neurological:     General: No focal deficit present.     Mental Status: He is alert and oriented to person, place, and time. Mental status is at baseline.  Psychiatric:        Mood and Affect: Mood normal.        Behavior: Behavior normal.        Thought Content: Thought content normal.        Judgment: Judgment normal.      UC Treatments / Results  Labs (all labs ordered are listed, but only abnormal results are displayed) Labs Reviewed - No data to display  EKG   Radiology No results found.  Procedures Laceration Repair  Date/Time: 11/18/2023 10:44 AM  Performed by: Gustavus Bryant, FNP Authorized by: Gustavus Bryant, FNP   Consent:    Consent obtained:  Verbal   Consent given by:  Patient   Risks, benefits, and alternatives were discussed: yes     Risks discussed:   Infection and pain   Alternatives discussed:  No treatment, delayed treatment and observation Universal protocol:    Procedure explained and questions answered to patient or proxy's satisfaction: yes     Immediately prior to procedure, a time out was called: yes     Patient identity confirmed:  Verbally with patient and arm band Anesthesia:    Anesthesia method:  Local infiltration   Local anesthetic:  Lidocaine 1% w/o epi Laceration details:    Location:  Finger   Finger location:  L long finger   Length (cm):  2 Pre-procedure details:    Preparation:  Patient was prepped and draped in usual sterile fashion Exploration:    Imaging outcome: foreign body not noted     Wound exploration: wound explored through full range of motion and entire depth of wound visualized     Contaminated: no   Treatment:    Area cleansed with:  Povidone-iodine   Amount of cleaning:  Standard Skin repair:    Repair method:  Sutures   Suture size:  4-0   Suture material:  Nylon   Suture technique:  Simple interrupted   Number of sutures:  5 Approximation:    Approximation:  Close Repair type:    Repair type:  Simple Post-procedure details:    Dressing:  Antibiotic ointment and non-adherent dressing   Procedure completion:  Tolerated well, no immediate complications  (including critical care time)  Medications Ordered in UC Medications  Tdap (BOOSTRIX) injection 0.5 mL (0.5 mLs Intramuscular Given 11/18/23 1039)    Initial Impression / Assessment and Plan / UC Course  I have reviewed the triage vital signs and the nursing notes.  Pertinent labs & imaging results that were available during my care of the patient were reviewed by me and considered in my medical decision making (see chart for details).     Laceration was repaired with sutures.  See procedure note.  No complications noted to physical exam.  Advised patient to return in 10 days to have sutures removed and to monitor for signs of  infection or complication.  Patient verbalized understanding and was agreeable with plan. Final Clinical Impressions(s) / UC  Diagnoses   Final diagnoses:  Laceration of left middle finger without foreign body without damage to nail, initial encounter     Discharge Instructions      Please return in 10 days to have sutures removed.  Monitor for signs of infection that include increased redness, swelling, pus and follow-up sooner if this occurs.    ED Prescriptions   None    PDMP not reviewed this encounter.   Gustavus Bryant, [place] 11/18/23 1046

## 2023-11-18 NOTE — Discharge Instructions (Signed)
Please return in 10 days to have sutures removed.  Monitor for signs of infection that include increased redness, swelling, pus and follow-up sooner if this occurs.

## 2023-11-18 NOTE — ED Triage Notes (Signed)
 Pt has laceration on left hand middle finger. Says incident happened while working on a car about an hour ago.

## 2023-11-19 ENCOUNTER — Telehealth: Payer: Self-pay

## 2023-11-19 NOTE — Telephone Encounter (Signed)
 Returning incoming call.   Original message: Phone Message... MRN [id number] Larry Stevenson had a question regarding their visit from yesterday, Number [phone]  Discussion @ time of call: Patient would like an "oral antibiotic just in case his laceration/repaired area was to get infected". Currently "no signs of infection or concerns with".   Message forwarded to provider(s) in clinic for review/advise.  Yancey Flemings CMA

## 2023-11-24 NOTE — Telephone Encounter (Signed)
 Patient notified
